# Patient Record
Sex: Female | Born: 1985 | Race: Black or African American | Hispanic: No | Marital: Single | Smoking: Current every day smoker
Health system: Southern US, Community
[De-identification: ages and names within clinical notes are randomized; demographics above are authoritative.]

## PROBLEM LIST (undated history)

## (undated) ENCOUNTER — Inpatient Hospital Stay (HOSPITAL_COMMUNITY): Payer: Self-pay

## (undated) DIAGNOSIS — Z789 Other specified health status: Secondary | ICD-10-CM

## (undated) HISTORY — PX: DILATION AND CURETTAGE OF UTERUS: SHX78

---
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
## 2009-08-20 ENCOUNTER — Inpatient Hospital Stay (HOSPITAL_COMMUNITY): Admission: AD | Admit: 2009-08-20 | Discharge: 2009-08-20 | Payer: Self-pay | Admitting: Obstetrics & Gynecology

## 2010-01-13 ENCOUNTER — Emergency Department (HOSPITAL_COMMUNITY): Admission: EM | Admit: 2010-01-13 | Discharge: 2010-01-13 | Payer: Self-pay | Admitting: Emergency Medicine

## 2011-01-14 ENCOUNTER — Emergency Department (HOSPITAL_COMMUNITY)
Admission: EM | Admit: 2011-01-14 | Discharge: 2011-01-14 | Disposition: A | Discharge status: Home / Self Care | Payer: Medicaid Other | Attending: Emergency Medicine | Admitting: Emergency Medicine

## 2011-01-14 ENCOUNTER — Emergency Department (HOSPITAL_COMMUNITY): Payer: Medicaid Other

## 2011-01-14 DIAGNOSIS — S81009A Unspecified open wound, unspecified knee, initial encounter: Secondary | ICD-10-CM | POA: Insufficient documentation

## 2011-01-14 DIAGNOSIS — S0180XA Unspecified open wound of other part of head, initial encounter: Secondary | ICD-10-CM | POA: Insufficient documentation

## 2011-01-14 DIAGNOSIS — S8000XA Contusion of unspecified knee, initial encounter: Secondary | ICD-10-CM | POA: Insufficient documentation

## 2011-01-14 DIAGNOSIS — S91009A Unspecified open wound, unspecified ankle, initial encounter: Secondary | ICD-10-CM | POA: Insufficient documentation

## 2011-01-14 DIAGNOSIS — S5000XA Contusion of unspecified elbow, initial encounter: Secondary | ICD-10-CM | POA: Insufficient documentation

## 2011-01-23 ENCOUNTER — Emergency Department (HOSPITAL_COMMUNITY)
Admission: EM | Admit: 2011-01-23 | Discharge: 2011-01-23 | Disposition: A | Discharge status: Home / Self Care | Payer: Medicaid Other | Attending: Emergency Medicine | Admitting: Emergency Medicine

## 2011-01-23 DIAGNOSIS — Z4802 Encounter for removal of sutures: Secondary | ICD-10-CM | POA: Insufficient documentation

## 2011-03-09 IMAGING — CR DG FOOT COMPLETE 3+V*R*
3 series · 3 of 3 positions shown · non-contrast
Comparison: None

CLINICAL DATA: Jammed daughter window - heel pain

RIGHT FOOT COMPLETE - 3+ VIEW

[t foot ap right]
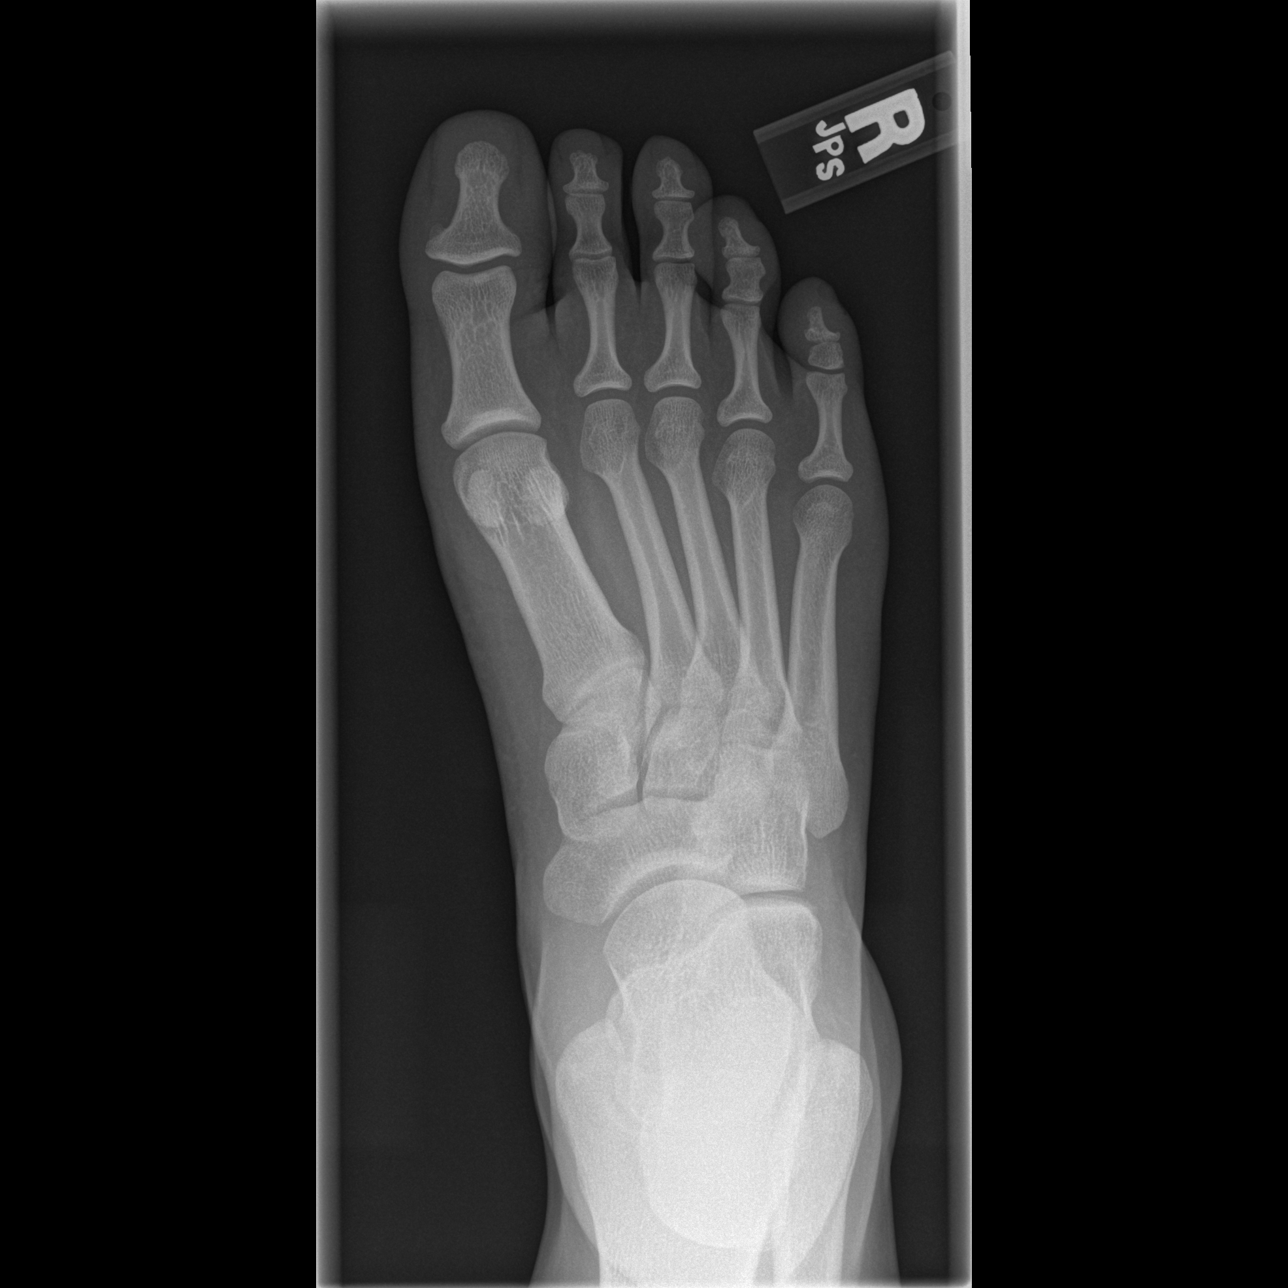

[t foot oblique right]
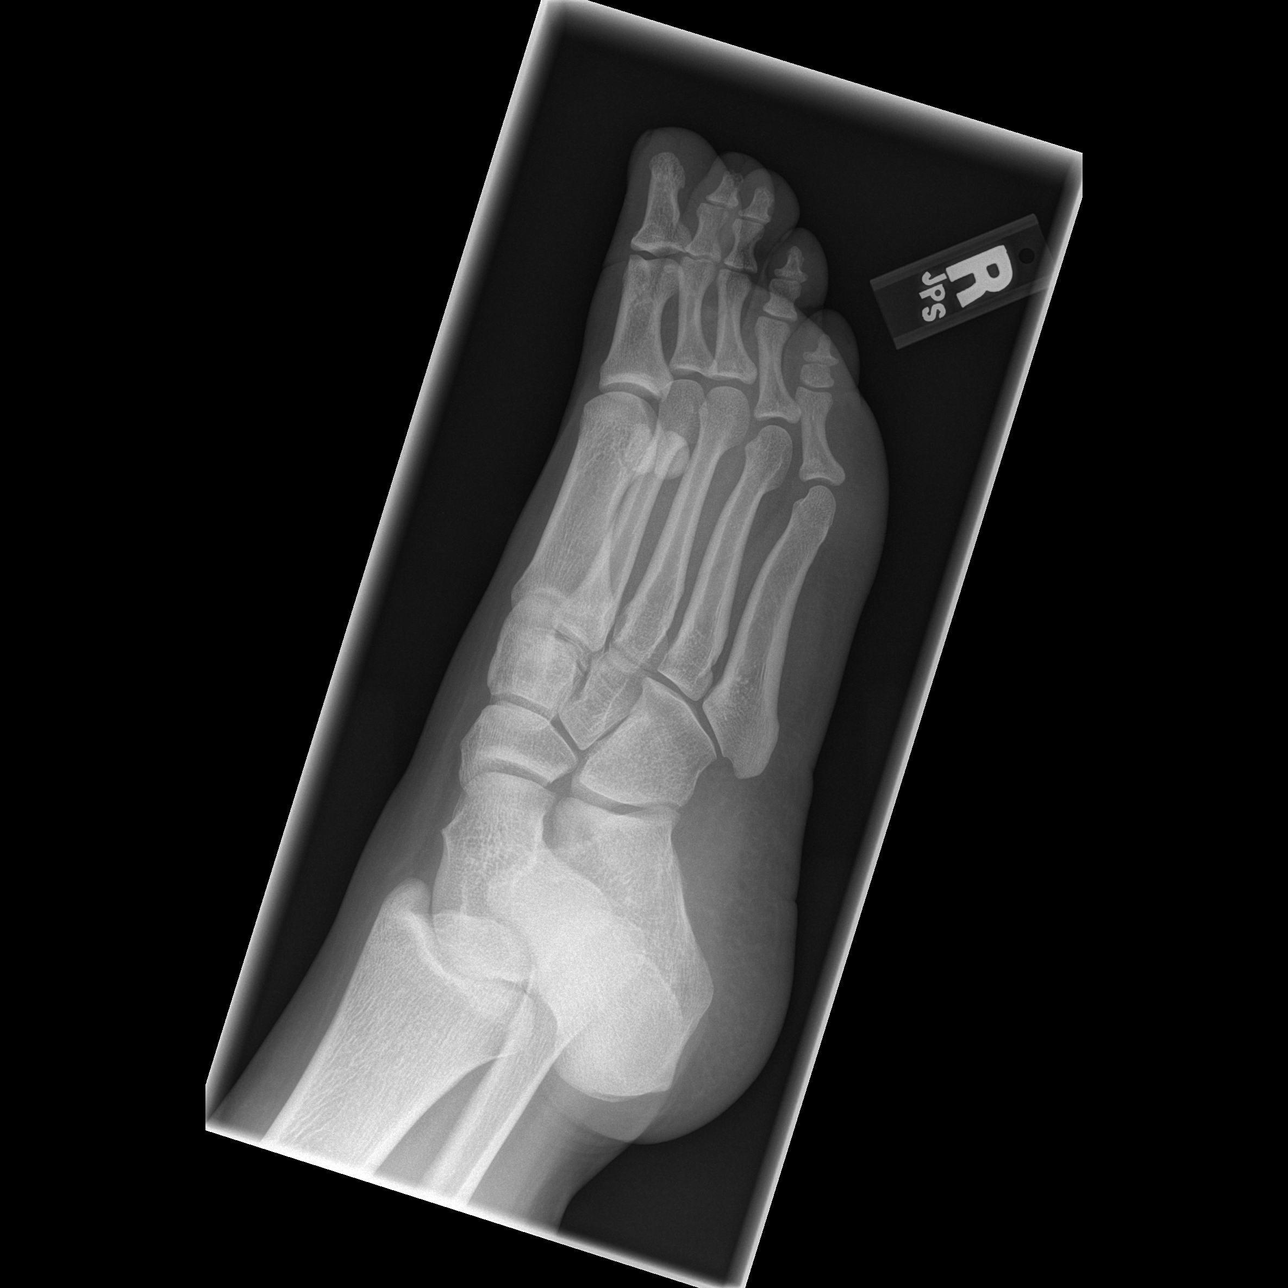

[t foot lat right]
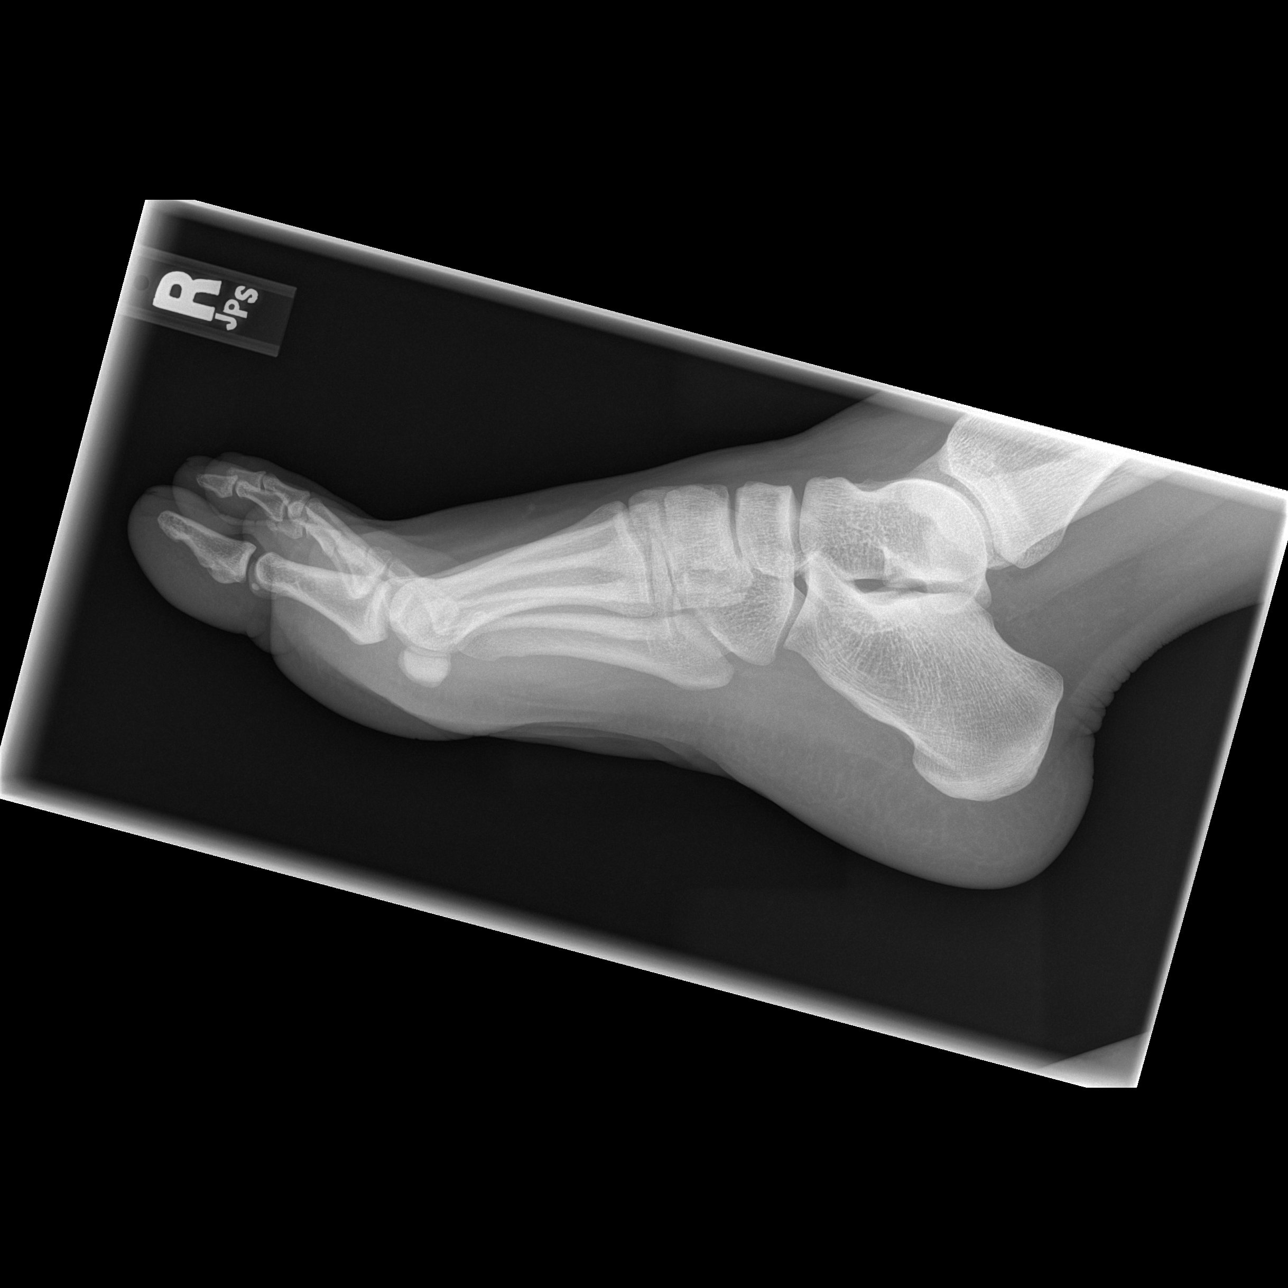

[3 of 3 positions shown; findings below may reference images not displayed]

FINDINGS: No fracture or dislocation.  No foreign body or other
abnormality of the soft tissues.
IMPRESSION: No acute or significant findings.

## 2011-03-11 LAB — URINALYSIS, ROUTINE W REFLEX MICROSCOPIC
Leukocytes, UA: NEGATIVE
Nitrite: NEGATIVE
Specific Gravity, Urine: 1.02 (ref 1.005–1.030)
Urobilinogen, UA: 0.2 mg/dL (ref 0.0–1.0)
pH: 6 (ref 5.0–8.0)

## 2011-03-11 LAB — WET PREP, GENITAL: Clue Cells Wet Prep HPF POC: NONE SEEN

## 2011-03-11 LAB — CBC
MCHC: 33.5 g/dL (ref 30.0–36.0)
MCV: 89.5 fL (ref 78.0–100.0)
Platelets: 227 10*3/uL (ref 150–400)
RDW: 13.2 % (ref 11.5–15.5)

## 2011-03-11 LAB — URINE MICROSCOPIC-ADD ON

## 2011-03-11 LAB — GC/CHLAMYDIA PROBE AMP, GENITAL: GC Probe Amp, Genital: NEGATIVE

## 2011-03-11 LAB — HCG, QUANTITATIVE, PREGNANCY: hCG, Beta Chain, Quant, S: 15858 m[IU]/mL — ABNORMAL HIGH (ref <5)

## 2012-06-12 IMAGING — CR DG KNEE COMPLETE 4+V*L*
4 series · 4 of 4 positions shown · non-contrast
Comparison: None.

CLINICAL DATA: Trauma

LEFT KNEE - COMPLETE 4+ VIEW

[t knee ap left]
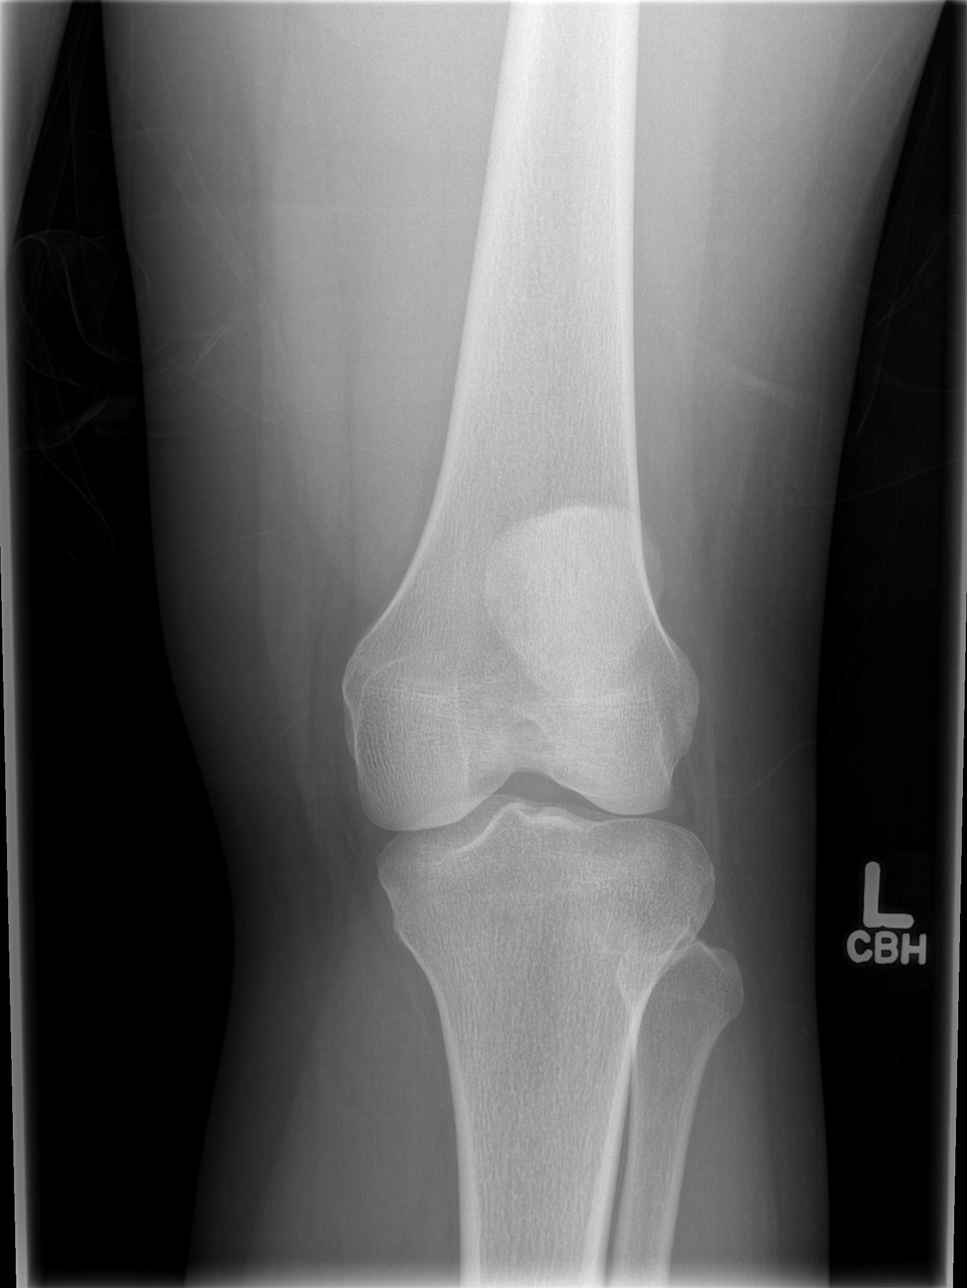

[t knee oblique left (1 of 2)]
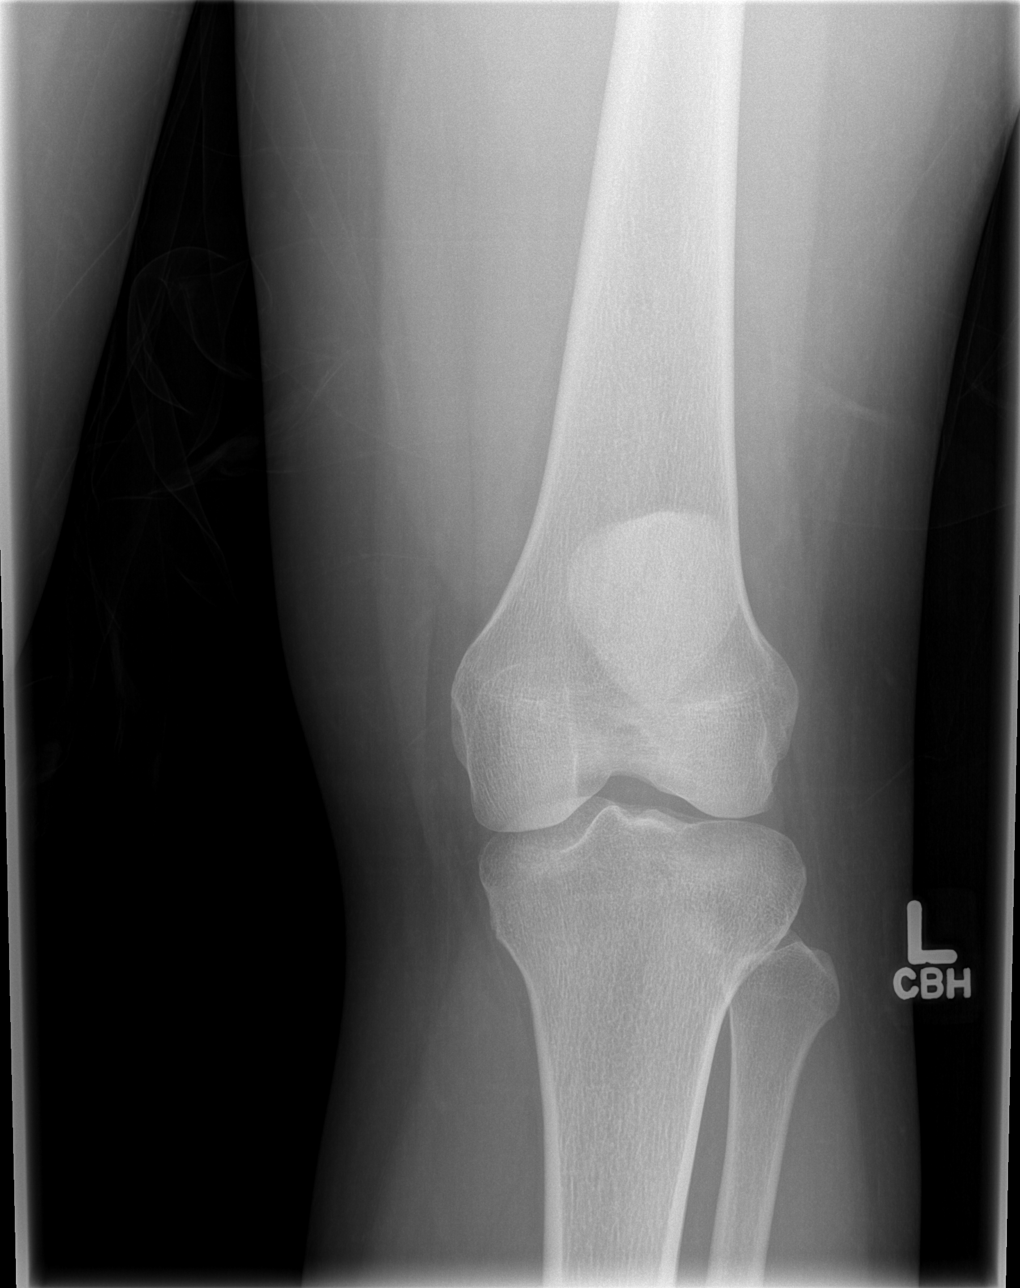

[t knee oblique left (2 of 2)]
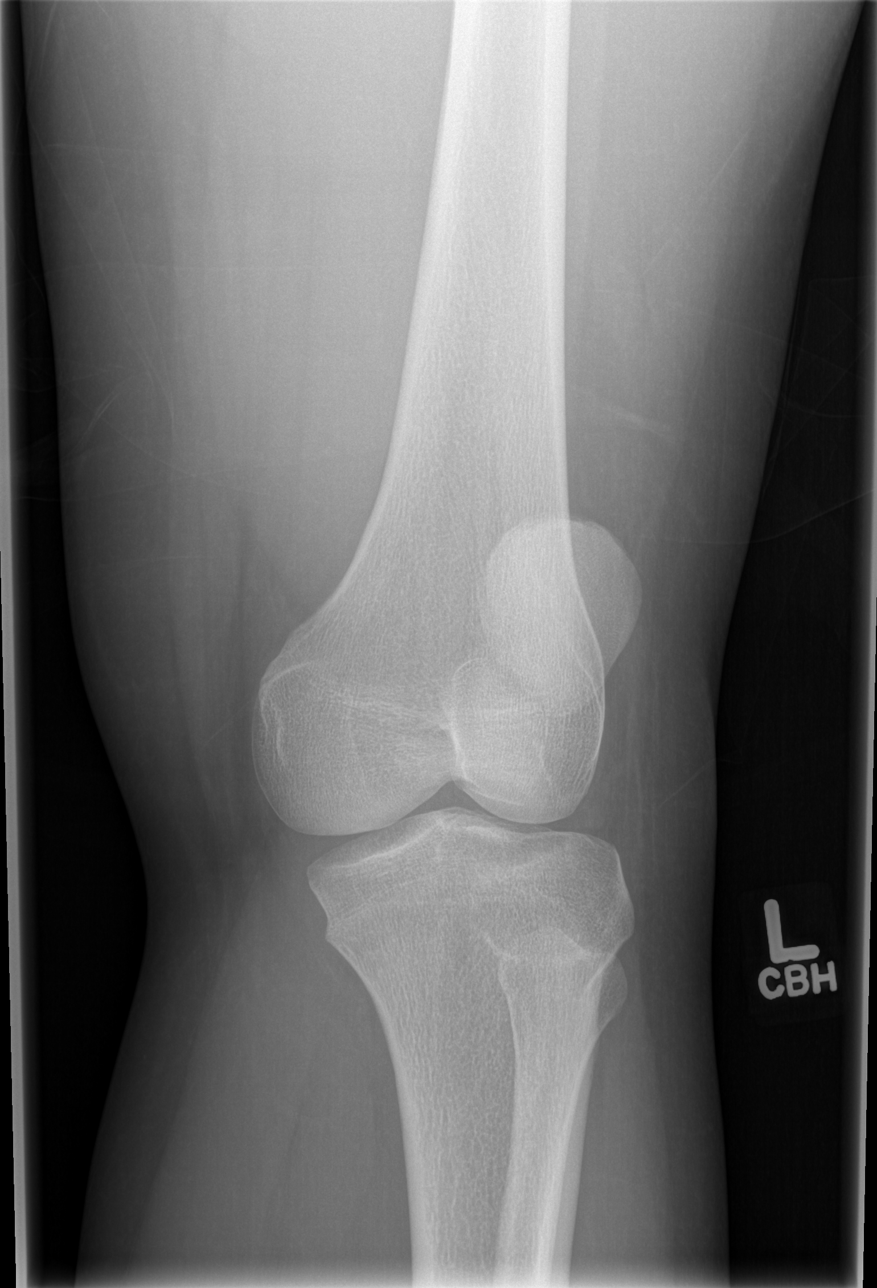

[t knee lat left]
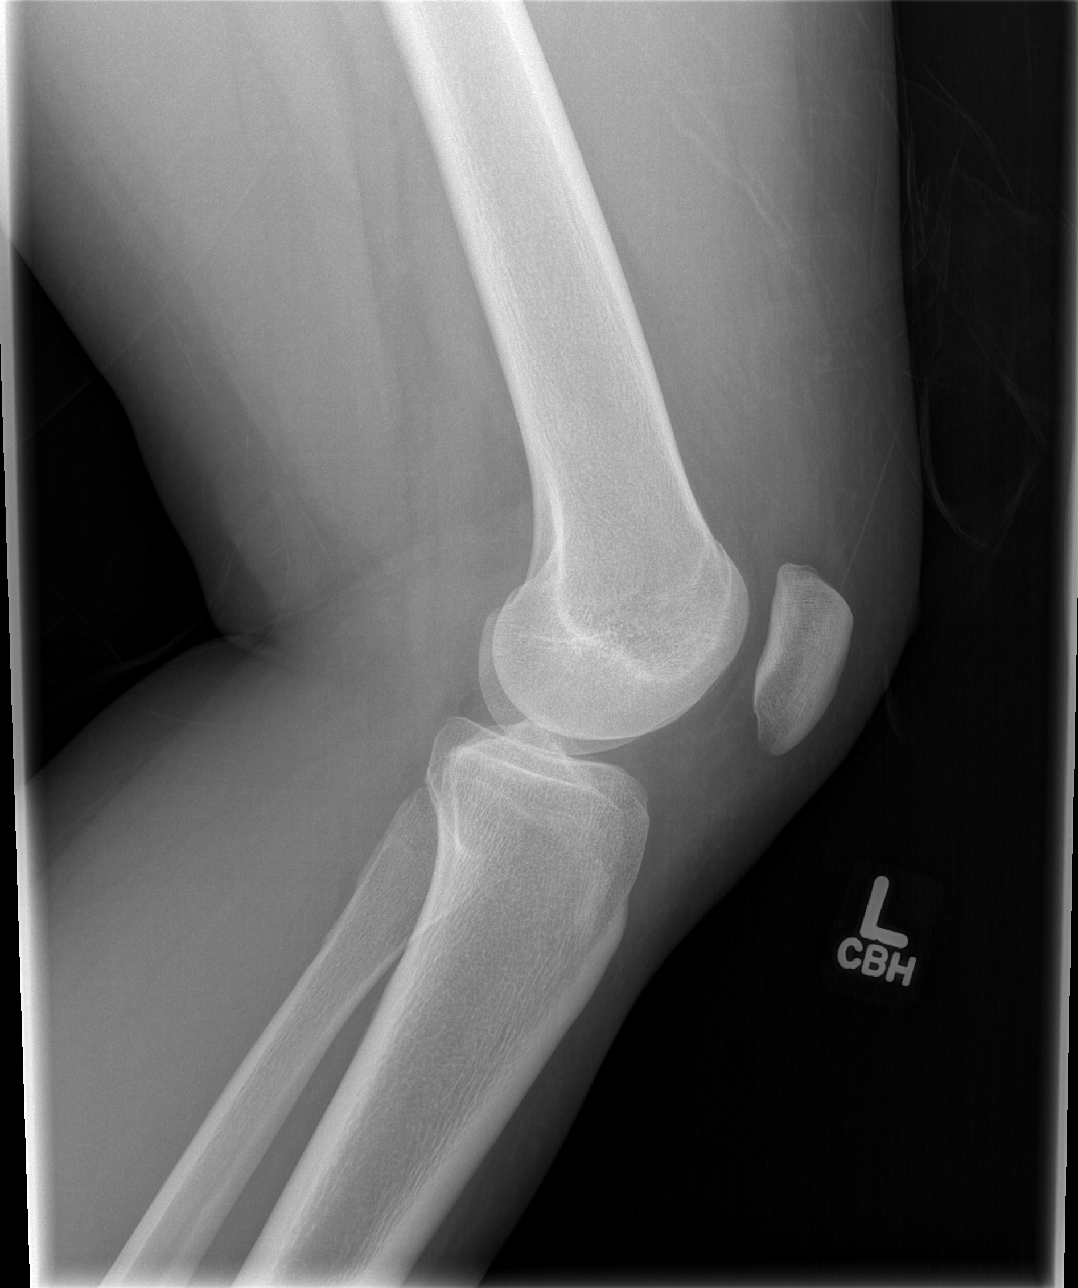

[4 of 4 positions shown; findings below may reference images not displayed]

FINDINGS: There is no evidence of acute fracture or malalignment.
The soft tissues are within normal limits.  No large joint effusion
is seen.
IMPRESSION: No acute findings.

## 2014-04-13 ENCOUNTER — Encounter (HOSPITAL_COMMUNITY): Payer: Self-pay | Admitting: *Deleted

## 2014-04-13 ENCOUNTER — Inpatient Hospital Stay (HOSPITAL_COMMUNITY)
Admission: AD | Admit: 2014-04-13 | Discharge: 2014-04-13 | Disposition: A | Discharge status: Home / Self Care | Payer: Medicaid Other | Source: Ambulatory Visit | Attending: Obstetrics & Gynecology | Admitting: Obstetrics & Gynecology

## 2014-04-13 DIAGNOSIS — O99330 Smoking (tobacco) complicating pregnancy, unspecified trimester: Secondary | ICD-10-CM | POA: Insufficient documentation

## 2014-04-13 DIAGNOSIS — R109 Unspecified abdominal pain: Secondary | ICD-10-CM | POA: Insufficient documentation

## 2014-04-13 DIAGNOSIS — O23592 Infection of other part of genital tract in pregnancy, second trimester: Secondary | ICD-10-CM

## 2014-04-13 DIAGNOSIS — A5901 Trichomonal vulvovaginitis: Secondary | ICD-10-CM

## 2014-04-13 DIAGNOSIS — Z202 Contact with and (suspected) exposure to infections with a predominantly sexual mode of transmission: Secondary | ICD-10-CM

## 2014-04-13 DIAGNOSIS — O98819 Other maternal infectious and parasitic diseases complicating pregnancy, unspecified trimester: Secondary | ICD-10-CM | POA: Insufficient documentation

## 2014-04-13 HISTORY — DX: Other specified health status: Z78.9

## 2014-04-13 LAB — WET PREP, GENITAL
Trich, Wet Prep: NONE SEEN
YEAST WET PREP: NONE SEEN

## 2014-04-13 LAB — URINALYSIS, ROUTINE W REFLEX MICROSCOPIC
Bilirubin Urine: NEGATIVE
GLUCOSE, UA: NEGATIVE mg/dL
Hgb urine dipstick: NEGATIVE
Ketones, ur: NEGATIVE mg/dL
NITRITE: NEGATIVE
PH: 8.5 — AB (ref 5.0–8.0)
Protein, ur: NEGATIVE mg/dL
Specific Gravity, Urine: 1.015 (ref 1.005–1.030)
Urobilinogen, UA: 0.2 mg/dL (ref 0.0–1.0)

## 2014-04-13 LAB — URINE MICROSCOPIC-ADD ON

## 2014-04-13 LAB — POCT PREGNANCY, URINE: PREG TEST UR: POSITIVE — AB

## 2014-04-13 MED ORDER — CEFTRIAXONE SODIUM 250 MG IJ SOLR
250.0000 mg | Freq: Once | INTRAMUSCULAR | Status: AC
  Administered 2014-04-13: 250 mg via INTRAMUSCULAR
  Filled 2014-04-13: qty 250

## 2014-04-13 MED ORDER — AZITHROMYCIN 250 MG PO TABS
1000.0000 mg | ORAL_TABLET | Freq: Once | ORAL | Status: AC
  Administered 2014-04-13: 1000 mg via ORAL
  Filled 2014-04-13: qty 4

## 2014-04-13 MED ORDER — METRONIDAZOLE 500 MG PO TABS
2000.0000 mg | ORAL_TABLET | Freq: Once | ORAL | Status: AC
  Administered 2014-04-13: 2000 mg via ORAL
  Filled 2014-04-13: qty 4

## 2014-04-13 NOTE — MAU Note (Signed)
Pt presents to MAU with complaints of pregnancy symptoms and states she needs to be tested for STDs.

## 2014-04-13 NOTE — MAU Provider Note (Signed)
History     CSN: [id number]  Arrival date and time: 04/13/14 1213   None     Chief Complaint  Patient presents with  . Possible Pregnancy   HPI 28 y.o. [id number] at 6232w2d with pregnancy symptoms, breast tenderness, cramping, vomiting, feeling "fluttering". Patient's last menstrual period was 12/20/2013. Depo Provera in October. Pt also states that her partner has told her he has gonorrhea, chlamydia and trichomonas.   Past Medical History  Diagnosis Date  . Medical history non-contributory     Past Surgical History  Procedure Laterality Date  . No past surgeries      History reviewed. No pertinent family history.  History  Substance Use Topics  . Smoking status: Current Every Day Smoker  . Smokeless tobacco: Not on file  . Alcohol Use: No    Allergies: No Known Allergies  Prescriptions prior to admission  Medication Sig Dispense Refill  . ibuprofen (ADVIL,MOTRIN) 200 MG tablet Take 400 mg by mouth every 6 (six) hours as needed for moderate pain.        Review of Systems  Constitutional: Negative.   Respiratory: Negative.   Cardiovascular: Negative.   Gastrointestinal: Positive for nausea and vomiting. Negative for abdominal pain, diarrhea and constipation.  Genitourinary: Negative for dysuria, urgency, frequency, hematuria and flank pain.       Negative for vaginal bleeding, + mild cramping  Musculoskeletal: Negative.   Neurological: Negative.   Psychiatric/Behavioral: Negative.    Physical Exam   Blood pressure 117/53, pulse 116, temperature 98.2 F (36.8 C), resp. rate 18, height 5\' 3"  (1.6 m), weight 178 lb (80.74 kg), last menstrual period 12/20/2013.  Physical Exam  Nursing note and vitals reviewed. Constitutional: She is oriented to person, place, and time. She appears well-developed and well-nourished. No distress.  HENT:  Head: Normocephalic and atraumatic.  Cardiovascular: Normal rate.   Respiratory: Effort normal.  GI: Soft. Bowel sounds are  normal. She exhibits no mass. There is no tenderness. There is no rebound and no guarding.  Genitourinary: There is no rash or lesion on the right labia. There is no rash or lesion on the left labia. Uterus is not tender. Enlarged: Size c/w dates. Cervix exhibits no motion tenderness, no discharge and no friability. Right adnexum displays no mass, no tenderness and no fullness. Left adnexum displays no mass, no tenderness and no fullness. No tenderness or bleeding around the vagina. Vaginal discharge (white, frothy) found.  Cervix closed  Musculoskeletal: Normal range of motion.  Neurological: She is alert and oriented to person, place, and time.  Skin: Skin is warm and dry.  Psychiatric: She has a normal mood and affect.    MAU Course  Procedures Results for orders placed during the hospital encounter of 04/13/14 (from the past 24 hour(s))  URINALYSIS, ROUTINE W REFLEX MICROSCOPIC     Status: Abnormal   Collection Time    04/13/14 12:25 PM      Result Value Ref Range   Color, Urine YELLOW  YELLOW   APPearance CLOUDY (*) CLEAR   Specific Gravity, Urine 1.015  1.005 - 1.030   pH 8.5 (*) 5.0 - 8.0   Glucose, UA NEGATIVE  NEGATIVE mg/dL   Hgb urine dipstick NEGATIVE  NEGATIVE   Bilirubin Urine NEGATIVE  NEGATIVE   Ketones, ur NEGATIVE  NEGATIVE mg/dL   Protein, ur NEGATIVE  NEGATIVE mg/dL   Urobilinogen, UA 0.2  0.0 - 1.0 mg/dL   Nitrite NEGATIVE  NEGATIVE   Leukocytes, UA TRACE (*)  NEGATIVE  URINE MICROSCOPIC-ADD ON     Status: Abnormal   Collection Time    04/13/14 12:25 PM      Result Value Ref Range   Squamous Epithelial / LPF MANY (*) RARE   WBC, UA 0-2  <3 WBC/hpf   RBC / HPF 0-2  <3 RBC/hpf   Bacteria, UA FEW (*) RARE   Urine-Other TRICHOMONAS PRESENT    POCT PREGNANCY, URINE     Status: Abnormal   Collection Time    04/13/14 12:32 PM      Result Value Ref Range   Preg Test, Ur POSITIVE (*) NEGATIVE  WET PREP, GENITAL     Status: Abnormal   Collection Time    04/13/14  12:45 PM      Result Value Ref Range   Yeast Wet Prep HPF POC NONE SEEN  NONE SEEN   Trich, Wet Prep NONE SEEN  NONE SEEN   Clue Cells Wet Prep HPF POC FEW (*) NONE SEEN   WBC, Wet Prep HPF POC FEW (*) NONE SEEN      Assessment and Plan   1. Contact with or exposure to venereal diseases   2. Trichomonal vaginitis in pregnancy in second trimester   Treated for GC/CT and Trich with Flagyl 2000 mg PO, Azithromycin 1000mg  PO, Rocephin 250 mg IM.  Pt will start prenatal care with Dr. Shawnie Ponsorn, pregnancy verification provided    Medication List         ibuprofen 200 MG tablet  Commonly known as:  ADVIL,MOTRIN  Take 400 mg by mouth every 6 (six) hours as needed for moderate pain.        Follow-up Information   Follow up with Alm BustardRN,HENRY H, MD. (for prenatal care)    Specialty:  Specialist   Contact information:   [address] [place] [id number] [phone]         Archie Pattenatalie K Carston Riedl 04/13/2014, 1:30 PM

## 2014-04-14 LAB — GC/CHLAMYDIA PROBE AMP
CT Probe RNA: POSITIVE — AB
GC PROBE AMP APTIMA: NEGATIVE

## 2014-06-21 ENCOUNTER — Inpatient Hospital Stay (HOSPITAL_COMMUNITY)
Admission: AD | Admit: 2014-06-21 | Discharge: 2014-06-21 | Disposition: A | Discharge status: Home / Self Care | Payer: Medicaid Other | Source: Ambulatory Visit | Attending: Obstetrics & Gynecology | Admitting: Obstetrics & Gynecology

## 2014-06-21 ENCOUNTER — Encounter (HOSPITAL_COMMUNITY): Payer: Self-pay | Admitting: *Deleted

## 2014-06-21 DIAGNOSIS — O9989 Other specified diseases and conditions complicating pregnancy, childbirth and the puerperium: Principal | ICD-10-CM

## 2014-06-21 DIAGNOSIS — O99891 Other specified diseases and conditions complicating pregnancy: Secondary | ICD-10-CM | POA: Insufficient documentation

## 2014-06-21 DIAGNOSIS — Y9389 Activity, other specified: Secondary | ICD-10-CM | POA: Insufficient documentation

## 2014-06-21 DIAGNOSIS — Y92009 Unspecified place in unspecified non-institutional (private) residence as the place of occurrence of the external cause: Secondary | ICD-10-CM | POA: Insufficient documentation

## 2014-06-21 DIAGNOSIS — R109 Unspecified abdominal pain: Secondary | ICD-10-CM | POA: Insufficient documentation

## 2014-06-21 DIAGNOSIS — O26899 Other specified pregnancy related conditions, unspecified trimester: Secondary | ICD-10-CM

## 2014-06-21 DIAGNOSIS — W010XXA Fall on same level from slipping, tripping and stumbling without subsequent striking against object, initial encounter: Secondary | ICD-10-CM | POA: Insufficient documentation

## 2014-06-21 DIAGNOSIS — O99330 Smoking (tobacco) complicating pregnancy, unspecified trimester: Secondary | ICD-10-CM | POA: Insufficient documentation

## 2014-06-21 LAB — URINALYSIS W MICROSCOPIC (NOT AT ARMC)
BILIRUBIN URINE: NEGATIVE
Glucose, UA: NEGATIVE mg/dL
Ketones, ur: NEGATIVE mg/dL
Leukocytes, UA: NEGATIVE
NITRITE: NEGATIVE
PROTEIN: NEGATIVE mg/dL
SPECIFIC GRAVITY, URINE: 1.02 (ref 1.005–1.030)
UROBILINOGEN UA: 0.2 mg/dL (ref 0.0–1.0)
pH: 6 (ref 5.0–8.0)

## 2014-06-21 LAB — WET PREP, GENITAL
CLUE CELLS WET PREP: NONE SEEN
Trich, Wet Prep: NONE SEEN
YEAST WET PREP: NONE SEEN

## 2014-06-21 MED ORDER — ACETAMINOPHEN 500 MG PO TABS
500.0000 mg | ORAL_TABLET | Freq: Four times a day (QID) | ORAL | Status: DC | PRN
  Administered 2014-06-21: 500 mg via ORAL
  Filled 2014-06-21: qty 1

## 2014-06-21 MED ORDER — CYCLOBENZAPRINE HCL 5 MG PO TABS
5.0000 mg | ORAL_TABLET | Freq: Once | ORAL | Status: AC
  Administered 2014-06-21: 5 mg via ORAL
  Filled 2014-06-21: qty 1

## 2014-06-21 NOTE — MAU Provider Note (Signed)
Attestation of Attending Supervision of Fellow: Evaluation and management procedures were performed by the Fellow under my supervision and collaboration.  I have reviewed the Fellow's note and chart, and I agree with the management and plan.    

## 2014-06-21 NOTE — Discharge Instructions (Signed)
Preterm Labor Information Preterm labor is when labor starts before you are [redacted] weeks pregnant. The normal length of pregnancy is 39 to 41 weeks.  CAUSES  The cause of preterm labor is not often known. The most common known cause is infection. RISK FACTORS  Having a history of preterm labor.  Having your water break before it should.  Having a placenta that covers the opening of the cervix.  Having a placenta that breaks away from the uterus.  Having a cervix that is too weak to hold the baby in the uterus.  Having too much fluid in the amniotic sac.  Taking drugs or smoking while pregnant.  Not gaining enough weight while pregnant.  Being younger than 8918 and older than 28 years old.  Having a low income.  Being African American. SYMPTOMS  Period-like cramps, belly (abdominal) pain, or back pain.  Contractions that are regular, as often as six in an hour. They may be mild or painful.  Contractions that start at the top of the belly. They then move to the lower belly and back.  Lower belly pressure that seems to get stronger.  Bleeding from the vagina.  Fluid leaking from the vagina. TREATMENT  Treatment depends on:  Your condition.  The condition of your baby.  How many weeks pregnant you are. Your doctor may have you:  Take medicine to stop contractions.  Stay in bed except to use the restroom (bed rest).  Stay in the hospital. WHAT SHOULD YOU DO IF YOU THINK YOU ARE IN PRETERM LABOR? Call your doctor right away. You need to go to the hospital right away.  HOW CAN YOU PREVENT PRETERM LABOR IN FUTURE PREGNANCIES?  Stop smoking, if you smoke.  Maintain healthy weight gain.  Do not take drugs or be around chemicals that are not needed.  Tell your doctor if you think you have an infection.  Tell your doctor if you had a preterm labor before. Document Released: 02/17/2009 Document Revised: 09/11/2013 Document Reviewed: 02/17/2009 [facility] Patient  Information 2015 [place], [place]. This information is not intended to replace advice given to you by your health care provider. Make sure you discuss any questions you have with your health care provider.    Fall Prevention and Home Safety Falls cause injuries and can affect all age groups. It is possible to prevent falls.  HOW TO PREVENT FALLS  Wear shoes with rubber soles that do not have an opening for your toes.  Keep the inside and outside of your house well lit.  Use night lights throughout your home.  Remove clutter from floors.  Clean up floor spills.  Remove throw rugs or fasten them to the floor with carpet tape.  Do not place electrical cords across pathways.  Put grab bars by your tub, shower, and toilet. Do not use towel bars as grab bars.  Put handrails on both sides of the stairway. Fix loose handrails.  Do not climb on stools or stepladders, if possible.  Do not wax your floors.  Repair uneven or unsafe sidewalks, walkways, or stairs.  Keep items you use a lot within reach.  Be aware of pets.  Keep emergency numbers next to the telephone.  Put smoke detectors in your home and near bedrooms. Ask your doctor what other things you can do to prevent falls. Document Released: 09/17/2009 Document Revised: 05/22/2012 Document Reviewed: 02/21/2012 [facility]Exit[facility] Patient Information 2015 [place], [place]. This information is not intended to replace advice given to you by  your health care provider. Make sure you discuss any questions you have with your health care provider. ° °

## 2014-06-21 NOTE — MAU Note (Signed)
Patient presents with complaint of abdominal cramping X 4 days that increased after she fell yesterday onto her right side when she slipped on a wet floor. Denies bleeding, ROM or decreased fetal movement.

## 2014-06-21 NOTE — MAU Provider Note (Signed)
°  History     CSN: [id number]  Arrival date and time: 06/21/14 [id number]   None     Chief Complaint  Patient presents with   Abdominal Pain   Fall   HPI Abdominal cramping started 4 days ago, intermittently, worse with activity, not regularly.  Feels symptoms are more related to lifting at work.  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  Hx of +chlamydia 04/2014  Larey SeatFell yesterday at home while mopping on right side.  +FM, no LOF, no VB, no dysuria  [facility]  Past Medical History  Diagnosis Date   Medical history non-contributory     Past Surgical History  Procedure Laterality Date   Dilation and curettage of uterus      History reviewed. No pertinent family history.  History  Substance Use Topics   Smoking status: Current Every Day Smoker   Smokeless tobacco: Never Used   Alcohol Use: No    Allergies: No Known Allergies  Prescriptions prior to admission  Medication Sig Dispense Refill   ibuprofen (ADVIL,MOTRIN) 200 MG tablet Take 400 mg by mouth every 6 (six) hours as needed for moderate pain.       Prenatal Vit-Fe Fumarate-FA (PRENATAL MULTIVITAMIN) TABS tablet Take 1 tablet by mouth daily at 12 noon.        ROS Physical Exam   Blood pressure 111/69, pulse 102, temperature 98.3 F (36.8 C), temperature source Oral, resp. rate 18, height 5\' 4"  (1.626 m), weight 82.555 kg (182 lb), last menstrual period 12/20/2013.  Physical Exam  MAU Course  Procedures  MDM UA, flexeril 5mg , tylenol 500mg , wet prep, repeat GC/CT, pelvic exam  Assessment and Plan  no abdominal cramping currently, no contractions.  Encourage PO fluids.  Advised on labor warnings, discussed/advised to return for any signs/symptoms of preterm labor.  Urine sent for culture given few bacteria, no other signs of infection  Fredirick LatheKristy Acosta 06/21/2014, 9:13 AM

## 2014-06-23 LAB — GC/CHLAMYDIA PROBE AMP
CT Probe RNA: NEGATIVE
GC PROBE AMP APTIMA: NEGATIVE

## 2014-10-06 ENCOUNTER — Encounter (HOSPITAL_COMMUNITY): Payer: Self-pay | Admitting: *Deleted

## 2015-02-16 ENCOUNTER — Encounter (HOSPITAL_COMMUNITY): Payer: Self-pay | Admitting: *Deleted
